# Patient Record
Sex: Female | Born: 1955
Health system: Southern US, Community
[De-identification: ages and names within clinical notes are randomized; demographics above are authoritative.]

## PROBLEM LIST (undated history)

## (undated) DIAGNOSIS — E039 Hypothyroidism, unspecified: Secondary | ICD-10-CM

## (undated) DIAGNOSIS — J45909 Unspecified asthma, uncomplicated: Secondary | ICD-10-CM

## (undated) HISTORY — PX: KNEE SURGERY: SHX244

## (undated) HISTORY — PX: CARPAL TUNNEL RELEASE: SHX101

## (undated) HISTORY — DX: Hypothyroidism, unspecified: E03.9

## (undated) HISTORY — PX: REPAIR ANKLE LIGAMENT: SUR1187

## (undated) HISTORY — PX: SQUAMOUS CELL CARCINOMA EXCISION: SHX2433

## (undated) HISTORY — DX: Unspecified asthma, uncomplicated: J45.909

---

## 2009-05-27 ENCOUNTER — Ambulatory Visit: Payer: Self-pay

## 2010-10-13 ENCOUNTER — Ambulatory Visit: Payer: Self-pay | Admitting: *Deleted

## 2012-01-02 ENCOUNTER — Ambulatory Visit: Payer: Self-pay | Admitting: Specialist

## 2012-08-28 ENCOUNTER — Ambulatory Visit: Payer: Self-pay | Admitting: Family Medicine

## 2014-09-24 DIAGNOSIS — J45990 Exercise induced bronchospasm: Secondary | ICD-10-CM | POA: Insufficient documentation

## 2014-10-05 ENCOUNTER — Other Ambulatory Visit: Payer: Self-pay | Admitting: Family Medicine

## 2014-10-05 ENCOUNTER — Ambulatory Visit
Admission: RE | Admit: 2014-10-05 | Discharge: 2014-10-05 | Disposition: A | Discharge status: Home / Self Care | Payer: BLUE CROSS/BLUE SHIELD | Source: Ambulatory Visit | Attending: Family Medicine | Admitting: Family Medicine

## 2014-10-05 DIAGNOSIS — S52501A Unspecified fracture of the lower end of right radius, initial encounter for closed fracture: Secondary | ICD-10-CM | POA: Insufficient documentation

## 2014-10-05 DIAGNOSIS — Y939 Activity, unspecified: Secondary | ICD-10-CM | POA: Insufficient documentation

## 2014-10-05 DIAGNOSIS — Y929 Unspecified place or not applicable: Secondary | ICD-10-CM | POA: Diagnosis not present

## 2014-10-05 DIAGNOSIS — X58XXXA Exposure to other specified factors, initial encounter: Secondary | ICD-10-CM | POA: Insufficient documentation

## 2014-10-05 DIAGNOSIS — Y999 Unspecified external cause status: Secondary | ICD-10-CM | POA: Insufficient documentation

## 2014-10-05 DIAGNOSIS — S63501A Unspecified sprain of right wrist, initial encounter: Secondary | ICD-10-CM | POA: Diagnosis present

## 2015-09-08 ENCOUNTER — Other Ambulatory Visit: Payer: Self-pay | Admitting: Pediatrics

## 2015-09-08 ENCOUNTER — Other Ambulatory Visit: Payer: Self-pay | Admitting: Family Medicine

## 2015-09-08 DIAGNOSIS — Z1231 Encounter for screening mammogram for malignant neoplasm of breast: Secondary | ICD-10-CM

## 2015-09-16 ENCOUNTER — Ambulatory Visit
Admission: RE | Admit: 2015-09-16 | Discharge: 2015-09-16 | Disposition: A | Discharge status: Home / Self Care | Payer: BLUE CROSS/BLUE SHIELD | Source: Ambulatory Visit | Attending: Pediatrics | Admitting: Pediatrics

## 2015-09-16 DIAGNOSIS — Z1231 Encounter for screening mammogram for malignant neoplasm of breast: Secondary | ICD-10-CM

## 2017-02-23 ENCOUNTER — Other Ambulatory Visit: Payer: Self-pay | Admitting: Internal Medicine

## 2017-02-23 ENCOUNTER — Ambulatory Visit
Admission: RE | Admit: 2017-02-23 | Discharge: 2017-02-23 | Disposition: A | Discharge status: Home / Self Care | Payer: BLUE CROSS/BLUE SHIELD | Source: Ambulatory Visit | Attending: Internal Medicine | Admitting: Internal Medicine

## 2017-02-23 DIAGNOSIS — R7611 Nonspecific reaction to tuberculin skin test without active tuberculosis: Secondary | ICD-10-CM | POA: Diagnosis present

## 2017-03-20 ENCOUNTER — Ambulatory Visit (INDEPENDENT_AMBULATORY_CARE_PROVIDER_SITE_OTHER): Payer: BLUE CROSS/BLUE SHIELD | Admitting: Obstetrics and Gynecology

## 2017-03-20 ENCOUNTER — Encounter: Payer: Self-pay | Admitting: Obstetrics and Gynecology

## 2017-03-20 VITALS — BP 124/78 | Ht 63.0 in | Wt 138.0 lb

## 2017-03-20 DIAGNOSIS — Z124 Encounter for screening for malignant neoplasm of cervix: Secondary | ICD-10-CM

## 2017-03-20 DIAGNOSIS — Z1211 Encounter for screening for malignant neoplasm of colon: Secondary | ICD-10-CM | POA: Diagnosis not present

## 2017-03-20 DIAGNOSIS — E039 Hypothyroidism, unspecified: Secondary | ICD-10-CM

## 2017-03-20 DIAGNOSIS — Z1339 Encounter for screening examination for other mental health and behavioral disorders: Secondary | ICD-10-CM

## 2017-03-20 DIAGNOSIS — Z01419 Encounter for gynecological examination (general) (routine) without abnormal findings: Secondary | ICD-10-CM | POA: Diagnosis not present

## 2017-03-20 DIAGNOSIS — Z1331 Encounter for screening for depression: Secondary | ICD-10-CM | POA: Diagnosis not present

## 2017-03-20 NOTE — Progress Notes (Signed)
Routine Annual Gynecology Examination   PCP: Delton Prairieobin, Paul, MD  Chief Complaint  Patient presents with  . Annual Exam    History of Present Illness: Patient is a 61 y.o. [id number] presents for annual exam. The patient has no complaints today.   Menopausal bleeding: denies  Menopausal symptoms: denies  Breast symptoms: denies  Last pap smear: 1 years ago.  Result: NILM, HPV+  Last mammogram: 08/2015 [facility]  Past Medical History:  Diagnosis Date  . Asthma   . Hypothyroidism     Past Surgical History:  Procedure Laterality Date  . CARPAL TUNNEL RELEASE    . CESAREAN SECTION    . KNEE SURGERY    . REPAIR ANKLE LIGAMENT      Medications   Medication Sig Start Date End Date Taking? Authorizing Provider  albuterol (PROVENTIL HFA;VENTOLIN HFA) 108 (90 Base) MCG/ACT inhaler 2 puffs 15 minutes prior to exercise 09/24/14  Yes [provider]  ARMOUR THYROID 90 MG tablet  12/11/16   [provider]    Allergies  Allergen Reactions  . Celecoxib    Obstetric History: [id number]  Social History   Social History  . Marital status: Married    Spouse name: N/A  . Number of children: N/A  . Years of education: N/A   Occupational History  . Not on file.   Social History Main Topics  . Smoking status: Never Smoker  . Smokeless tobacco: Never Used  . Alcohol use Yes  . Drug use: No  . Sexual activity: Not on file   Other Topics Concern  . Not on file   Social History Narrative  . No narrative on file    Family History  Problem Relation Age of Onset  . Breast cancer Mother 2975  . Coronary artery disease Mother   . Hypertension Mother   . Breast cancer Cousin   . Hypertension Father   . Stroke Father   . Thyroid cancer Father   . Hypertension Maternal Grandmother   . Hypertension Maternal Grandfather   . Hypertension Paternal Grandmother   . Hypertension Paternal Grandfather     Review of Systems  Constitutional: Negative.   HENT:  Negative.   Eyes: Negative.   Respiratory: Negative.   Cardiovascular: Negative.   Gastrointestinal: Negative.   Genitourinary: Negative.   Musculoskeletal: Negative.   Skin: Negative.   Neurological: Negative.   Psychiatric/Behavioral: Negative.      Physical Exam Vitals: BP 124/78   Ht 5\' 3"  (1.6 m)   Wt 138 lb (62.6 kg)   LMP  (LMP Unknown) Comment: denies preg  BMI 24.45 kg/m   Physical Exam  Constitutional: She is oriented to person, place, and time. She appears well-developed and well-nourished. No distress.  Genitourinary: Vagina normal and uterus normal. Pelvic exam was performed with patient supine. There is no rash, tenderness or lesion on the right labia. There is no rash, tenderness or lesion on the left labia. Right adnexum does not display mass, does not display tenderness and does not display fullness. Left adnexum does not display mass, does not display tenderness and does not display fullness. Cervix does not exhibit motion tenderness, lesion, discharge or polyp.   Uterus is mobile and anteverted. Uterus is not enlarged, tender, exhibiting a mass or irregular (is regular).  HENT:  Head: Normocephalic and atraumatic.  Eyes: EOM are normal. No scleral icterus.  Neck: Normal range of motion. Neck supple. No thyromegaly present.  Cardiovascular: Normal rate and regular rhythm.  Exam reveals no gallop and no friction rub.   No murmur heard. Pulmonary/Chest: Effort normal and breath sounds normal. No respiratory distress. She has no wheezes. She has no rales.  Abdominal: Soft. Bowel sounds are normal. She exhibits no distension and no mass. There is no tenderness. There is no rebound and no guarding.  Musculoskeletal: Normal range of motion. She exhibits no edema.  Lymphadenopathy:    She has no cervical adenopathy.  Neurological: She is alert and oriented to person, place, and time. No cranial nerve deficit.  Skin: Skin is warm and dry. No erythema.  Psychiatric: She  has a normal mood and affect. Her behavior is normal. Judgment normal.     Female chaperone present for pelvic and breast  portions of the physical exam  Results: AUDIT Questionnaire (screen for alcoholism): 3 PHQ-9: 0   Assessment and Plan:  61 y.o. G31P2002 female here for routine annual gynecologic examination  Plan: Problem List Items Addressed This Visit    Acquired hypothyroidism   Relevant Medications   ARMOUR THYROID 90 MG tablet   Other Relevant Orders   TSH + free T4 (Completed)    Other Visit Diagnoses    Women's annual routine gynecological examination    -  Primary   Relevant Orders   IGP, Aptima HPV, rfx 16/18,45   Pap smear for cervical cancer screening       Relevant Orders   IGP, Aptima HPV, rfx 16/18,45   Screening for depression       Screening for alcoholism          Screening: -- Blood pressure screen normal -- Colonoscopy - need to verify, if she is receiving this through me or another provider (Dr. Mariana Kaufman, I believe is her PCP) -- Mammogram - due in April as patient has elected to get hers every two years -- Weight screening: normal -- Depression screening negative (PHQ-9) -- Nutrition: normal -- cholesterol screening: patient refuses, as she states that she will not take medication or treatment regardless of results -- osteoporosis screening: not due -- tobacco screening: not using -- alcohol screening: AUDIT questionnaire indicates low-risk usage. -- family history of breast cancer screening: done. not at high risk. -- no evidence of domestic violence or intimate partner violence. -- STD screening: gonorrhea/chlamydia NAAT not collected per patient request. 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 She does not want to have it at this time.  Thomasene Mohair, MD 03/23/2017 11:05 AM

## 2017-03-21 LAB — TSH+FREE T4
Free T4: 0.88 ng/dL (ref 0.82–1.77)
TSH: 0.921 u[IU]/mL (ref 0.450–4.500)

## 2017-03-23 ENCOUNTER — Encounter: Payer: Self-pay | Admitting: Obstetrics and Gynecology

## 2017-03-23 LAB — IGP, APTIMA HPV, RFX 16/18,45
HPV APTIMA: NEGATIVE
PAP SMEAR COMMENT: 0

## 2017-03-26 MED ORDER — ARMOUR THYROID 90 MG PO TABS: 90.0000 mg | ORAL_TABLET | Freq: Every day | ORAL | 4 refills | Status: DC

## 2017-03-26 NOTE — Addendum Note (Signed)
Addended by: Thomasene MohairJACKSON, Markey Deady D on: 03/26/2017 09:52 PM   Modules accepted: Orders

## 2018-04-05 ENCOUNTER — Encounter: Payer: Self-pay | Admitting: Obstetrics and Gynecology

## 2018-04-05 ENCOUNTER — Ambulatory Visit (INDEPENDENT_AMBULATORY_CARE_PROVIDER_SITE_OTHER): Payer: BLUE CROSS/BLUE SHIELD | Admitting: Obstetrics and Gynecology

## 2018-04-05 VITALS — BP 128/74 | Ht 63.0 in | Wt 134.0 lb

## 2018-04-05 DIAGNOSIS — S52539A Colles' fracture of unspecified radius, initial encounter for closed fracture: Secondary | ICD-10-CM | POA: Insufficient documentation

## 2018-04-05 DIAGNOSIS — Z01419 Encounter for gynecological examination (general) (routine) without abnormal findings: Secondary | ICD-10-CM | POA: Diagnosis not present

## 2018-04-05 DIAGNOSIS — Z1331 Encounter for screening for depression: Secondary | ICD-10-CM

## 2018-04-05 NOTE — Progress Notes (Signed)
Routine Annual Gynecology Examination   PCP: Delton Prairie, MD  Chief Complaint  Patient presents with  . Annual Exam   History of Present Illness: Patient is a 62 y.o. G2P2002 presents for annual exam. The patient has no complaints today.   Menopausal bleeding: denies  Menopausal symptoms: denies  Breast symptoms: denies  Last pap smear: 1 years ago.  Result Normal  Last mammogram: 2 years ago.  Result Normal   Colonoscopy: She is due and has scheduled.   Last thyroid check: June, normal labs. Stable on current dose of Armour thyroid for years.  Past Medical History:  Diagnosis Date  . Asthma   . Hypothyroidism    Past Surgical History:  Procedure Laterality Date  . CARPAL TUNNEL RELEASE    . CESAREAN SECTION    . KNEE SURGERY    . REPAIR ANKLE LIGAMENT    . SQUAMOUS CELL CARCINOMA EXCISION     Prior to Admission medications   Medication Sig Start Date End Date Taking? Authorizing Provider  albuterol (PROVENTIL HFA;VENTOLIN HFA) 108 (90 Base) MCG/ACT inhaler 2 puffs 15 minutes prior to exercise 09/24/14  Yes [provider]  ARMOUR THYROID 90 MG tablet Take 1 tablet (90 mg total) by mouth daily. 03/26/17  Yes Conard Novak, MD    Allergies  Allergen Reactions  . Celecoxib    Obstetric History: [id number]  Social History   Socioeconomic History  . Marital status: Married    Spouse name: Not on file  . Number of children: Not on file  . Years of education: Not on file  . Highest education level: Not on file  Occupational History  . Not on file  Social Needs  . Financial resource strain: Not on file  . Food insecurity:    Worry: Not on file    Inability: Not on file  . Transportation needs:    Medical: Not on file    Non-medical: Not on file  Tobacco Use  . Smoking status: Never Smoker  . Smokeless tobacco: Never Used  Substance and Sexual Activity  . Alcohol use: Yes  . Drug use: No  . Sexual activity: Not on file  Lifestyle  .  Physical activity:    Days per week: Not on file    Minutes per session: Not on file  . Stress: Not on file  Relationships  . Social connections:    Talks on phone: Not on file    Gets together: Not on file    Attends religious service: Not on file    Active member of club or organization: Not on file    Attends meetings of clubs or organizations: Not on file    Relationship status: Not on file  . Intimate partner violence:    Fear of current or ex partner: Not on file    Emotionally abused: Not on file    Physically abused: Not on file    Forced sexual activity: Not on file  Other Topics Concern  . Not on file  Social History Narrative  . Not on file    Family History  Problem Relation Age of Onset  . Breast cancer Mother 64  . Coronary artery disease Mother   . Hypertension Mother   . Breast cancer Cousin   . Hypertension Father   . Stroke Father   . Thyroid cancer Father   . Hypertension Maternal Grandmother   . Hypertension Maternal Grandfather   . Hypertension Paternal Grandmother   .  Hypertension Paternal Grandfather     Review of Systems  Constitutional: Negative.   HENT: Negative.   Eyes: Negative.   Respiratory: Negative.   Cardiovascular: Negative.   Gastrointestinal: Negative.   Genitourinary: Negative.   Musculoskeletal: Negative.   Skin: Negative.   Neurological: Negative.   Psychiatric/Behavioral: Negative.      Physical Exam Vitals: BP 128/74   Ht 5\' 3"  (1.6 m)   Wt 134 lb (60.8 kg)   LMP  (LMP Unknown) Comment: denies preg  BMI 23.74 kg/m   Physical Exam  Constitutional: She is oriented to person, place, and time. She appears well-developed and well-nourished. No distress.  Genitourinary: Uterus normal. Pelvic exam was performed with patient supine. There is no rash, tenderness, lesion or injury on the right labia. There is no rash, tenderness, lesion or injury on the left labia. No erythema, tenderness or bleeding in the vagina. No signs  of injury around the vagina. No vaginal discharge found. Right adnexum does not display mass, does not display tenderness and does not display fullness. Left adnexum does not display mass, does not display tenderness and does not display fullness. Cervix does not exhibit motion tenderness, lesion, discharge or polyp.   Uterus is mobile and anteverted. Uterus is not enlarged, tender or exhibiting a mass.  HENT:  Head: Normocephalic and atraumatic.  Eyes: EOM are normal. No scleral icterus.  Neck: Normal range of motion. Neck supple. No thyromegaly present.  Cardiovascular: Normal rate and regular rhythm. Exam reveals no gallop and no friction rub.  No murmur heard. Pulmonary/Chest: Effort normal and breath sounds normal. No respiratory distress. She has no wheezes. She has no rales. Right breast exhibits no inverted nipple, no mass, no nipple discharge, no skin change and no tenderness. Left breast exhibits no inverted nipple, no mass, no nipple discharge, no skin change and no tenderness.  Abdominal: Soft. Bowel sounds are normal. She exhibits no distension and no mass. There is no tenderness. There is no rebound and no guarding.  Musculoskeletal: Normal range of motion. She exhibits no edema or tenderness.  Lymphadenopathy:    She has no cervical adenopathy.       Right: No inguinal adenopathy present.       Left: No inguinal adenopathy present.  Neurological: She is alert and oriented to person, place, and time. No cranial nerve deficit.  Skin: Skin is warm and dry. No rash noted. No erythema.  Psychiatric: She has a normal mood and affect. Her behavior is normal. Judgment normal.    Female chaperone present for pelvic and breast  portions of the physical exam  Results: PHQ-9: 0   Assessment and Plan:  62 y.o. G3P2002 female here for routine annual gynecologic examination  Plan: Problem List Items Addressed This Visit    None    Visit Diagnoses    Women's annual routine  gynecological examination    -  Primary   Screening for depression          Screening: -- Blood pressure screen normal -- Colonoscopy - due. Patient already taking steps to schedule -- Mammogram - due. Patient to call [facility] to arrange. She understands that it is her responsibility to arrange this. -- Weight screening: normal -- Depression screening negative (PHQ-9) -- Nutrition: normal -- cholesterol screening: patient declines -- osteoporosis screening: not due -- tobacco screening: not using -- family history of breast cancer screening: done. not at high risk. -- no evidence of domestic violence or intimate partner violence. -- STD  screening: gonorrhea/chlamydia NAAT not collected per patient request. -- pap smear not collected per ASCCP guidelines -- flu vaccine up to date -- HPV vaccination series: not eligilbe   Thomasene Mohair, MD 04/05/2018 5:50 PM

## 2018-04-22 ENCOUNTER — Other Ambulatory Visit: Payer: Self-pay | Admitting: Obstetrics and Gynecology

## 2018-04-22 DIAGNOSIS — Z1231 Encounter for screening mammogram for malignant neoplasm of breast: Secondary | ICD-10-CM

## 2018-05-02 ENCOUNTER — Ambulatory Visit
Admission: RE | Admit: 2018-05-02 | Discharge: 2018-05-02 | Disposition: A | Discharge status: Home / Self Care | Payer: BLUE CROSS/BLUE SHIELD | Source: Ambulatory Visit | Attending: Obstetrics and Gynecology | Admitting: Obstetrics and Gynecology

## 2018-05-02 DIAGNOSIS — Z1231 Encounter for screening mammogram for malignant neoplasm of breast: Secondary | ICD-10-CM | POA: Insufficient documentation

## 2018-05-13 ENCOUNTER — Other Ambulatory Visit: Payer: Self-pay | Admitting: Obstetrics and Gynecology

## 2018-05-13 DIAGNOSIS — E039 Hypothyroidism, unspecified: Secondary | ICD-10-CM

## 2019-04-18 ENCOUNTER — Ambulatory Visit: Payer: BLUE CROSS/BLUE SHIELD | Admitting: Obstetrics and Gynecology

## 2019-05-09 ENCOUNTER — Other Ambulatory Visit: Payer: Self-pay

## 2019-05-09 ENCOUNTER — Encounter: Payer: Self-pay | Admitting: Obstetrics and Gynecology

## 2019-05-09 ENCOUNTER — Ambulatory Visit (INDEPENDENT_AMBULATORY_CARE_PROVIDER_SITE_OTHER): Payer: BC Managed Care – PPO | Admitting: Obstetrics and Gynecology

## 2019-05-09 VITALS — BP 118/70 | Ht 63.0 in | Wt 134.0 lb

## 2019-05-09 DIAGNOSIS — E039 Hypothyroidism, unspecified: Secondary | ICD-10-CM

## 2019-05-09 DIAGNOSIS — Z1339 Encounter for screening examination for other mental health and behavioral disorders: Secondary | ICD-10-CM

## 2019-05-09 DIAGNOSIS — Z01419 Encounter for gynecological examination (general) (routine) without abnormal findings: Secondary | ICD-10-CM | POA: Diagnosis not present

## 2019-05-09 DIAGNOSIS — Z1331 Encounter for screening for depression: Secondary | ICD-10-CM

## 2019-05-09 NOTE — Progress Notes (Signed)
Routine Annual Gynecology Examination   PCP: Delton Prairie, MD  Chief Complaint  Patient presents with  . Annual Exam    History of Present Illness: Patient is a 63 y.o. G2P2002 presents for annual exam. The patient has no complaints today.   Menopausal bleeding: denies  Menopausal symptoms: denies  Breast symptoms: denies  Last pap smear: 2 years ago.  Result Normal  Last mammogram: 1 years ago.  Result Normal   Last colonoscopy: 1 year ago.  Normal.  Follow up 10 years.   Past Medical History:  Diagnosis Date  . Asthma   . Hypothyroidism     Past Surgical History:  Procedure Laterality Date  . CARPAL TUNNEL RELEASE    . CESAREAN SECTION    . KNEE SURGERY    . REPAIR ANKLE LIGAMENT    . SQUAMOUS CELL CARCINOMA EXCISION      Prior to Admission medications   Medication Sig Start Date End Date Taking? Authorizing Provider  albuterol (PROVENTIL HFA;VENTOLIN HFA) 108 (90 Base) MCG/ACT inhaler 2 puffs 15 minutes prior to exercise 09/24/14  Yes [provider]  ARMOUR THYROID 90 MG tablet TAKE (1) TABLET BY MOUTH EVERY DAY 05/13/18  Yes Conard Novak, MD    Allergies  Allergen Reactions  . Celecoxib    Obstetric History: [id number]  Social History   Socioeconomic History  . Marital status: Married    Spouse name: Not on file  . Number of children: Not on file  . Years of education: Not on file  . Highest education level: Not on file  Occupational History  . Not on file  Tobacco Use  . Smoking status: Never Smoker  . Smokeless tobacco: Never Used  Substance and Sexual Activity  . Alcohol use: Yes  . Drug use: No  . Sexual activity: Not on file  Other Topics Concern  . Not on file  Social History Narrative  . Not on file   Social Determinants of Health   Financial Resource Strain:   . Difficulty of Paying Living Expenses: Not on file  Food Insecurity:   . Worried About Programme researcher, broadcasting/film/video in the Last Year: Not on file  . Ran Out of  Food in the Last Year: Not on file  Transportation Needs:   . Lack of Transportation (Medical): Not on file  . Lack of Transportation (Non-Medical): Not on file  Physical Activity:   . Days of Exercise per Week: Not on file  . Minutes of Exercise per Session: Not on file  Stress:   . Feeling of Stress : Not on file  Social Connections:   . Frequency of Communication with Friends and Family: Not on file  . Frequency of Social Gatherings with Friends and Family: Not on file  . Attends Religious Services: Not on file  . Active Member of Clubs or Organizations: Not on file  . Attends Banker Meetings: Not on file  . Marital Status: Not on file  Intimate Partner Violence:   . Fear of Current or Ex-Partner: Not on file  . Emotionally Abused: Not on file  . Physically Abused: Not on file  . Sexually Abused: Not on file    Family History  Problem Relation Age of Onset  . Breast cancer Mother 53  . Coronary artery disease Mother   . Hypertension Mother   . Breast cancer Cousin        mat cousin  . Hypertension Father   . Stroke  Father   . Thyroid cancer Father   . Hypertension Maternal Grandmother   . Hypertension Maternal Grandfather   . Hypertension Paternal Grandmother   . Hypertension Paternal Grandfather     Review of Systems  Constitutional: Negative.   HENT: Negative.   Eyes: Negative.   Respiratory: Negative.   Cardiovascular: Negative.   Gastrointestinal: Negative.   Genitourinary: Negative.   Musculoskeletal: Negative.   Skin: Negative.   Neurological: Negative.   Psychiatric/Behavioral: Negative.     Physical Exam Vitals: BP 118/70   Ht  (1.6 m)   Wt 134 lb (60.8 kg)   LMP  (LMP Unknown) Comment: denies preg  BMI 23.74 kg/m   Physical Exam Constitutional:      General: She is not in acute distress.    Appearance: Normal appearance. She is well-developed.  Genitourinary:     Pelvic exam was performed with patient supine.     Vulva,  urethra, bladder and uterus normal.     No inguinal adenopathy present in the right or left side.    No signs of injury in the vagina.     No vaginal discharge, erythema, tenderness or bleeding.     No cervical motion tenderness, discharge, lesion or polyp.     Uterus is mobile.     Uterus is not enlarged or tender.     No uterine mass detected.    Uterus is anteverted.     No right or left adnexal mass present.     Right adnexa not tender or full.     Left adnexa not tender or full.  HENT:     Head: Normocephalic and atraumatic.  Eyes:     General: No scleral icterus.    Conjunctiva/sclera: Conjunctivae normal.  Neck:     Thyroid: No thyromegaly.  Cardiovascular:     Rate and Rhythm: Normal rate and regular rhythm.     Heart sounds: No murmur. No friction rub. No gallop.   Pulmonary:     Effort: Pulmonary effort is normal. No respiratory distress.     Breath sounds: Normal breath sounds. No wheezing or rales.  Chest:     Breasts:        Right: No inverted nipple, mass, nipple discharge, skin change or tenderness.        Left: No inverted nipple, mass, nipple discharge, skin change or tenderness.  Abdominal:     General: Bowel sounds are normal. There is no distension.     Palpations: Abdomen is soft. There is no mass.     Tenderness: There is no abdominal tenderness. There is no guarding or rebound.  Musculoskeletal:        General: No swelling or tenderness. Normal range of motion.     Cervical back: Normal range of motion and neck supple.  Lymphadenopathy:     Cervical: No cervical adenopathy.     Lower Body: No right inguinal adenopathy. No left inguinal adenopathy.  Neurological:     General: No focal deficit present.     Mental Status: She is alert and oriented to person, place, and time.     Cranial Nerves: No cranial nerve deficit.  Skin:    General: Skin is warm and dry.     Findings: No erythema or rash.  Psychiatric:        Mood and Affect: Mood normal.         Behavior: Behavior normal.        Judgment: Judgment normal.  Female chaperone present for pelvic and breast  portions of the physical exam  Results: AUDIT Questionnaire (screen for alcoholism): 0 PHQ-9: 0   Assessment and Plan:  63 y.o. G22P2002 female here for routine annual gynecologic examination  Plan: Problem List Items Addressed This Visit      Endocrine   Acquired hypothyroidism   Relevant Orders   TSH + free T4 ([facility])    Other Visit Diagnoses    Women's annual routine gynecological examination    -  Primary   Relevant Orders   TSH + free T4 ([facility])   Screening for depression       Screening for alcoholism          Screening: -- Blood pressure screen normal -- Colonoscopy - not due -- Mammogram - not due -- Weight screening: normal -- Depression screening negative (PHQ-9) -- Nutrition: normal -- cholesterol screening: per PCP -- osteoporosis screening: not due -- tobacco screening: not using -- alcohol screening: AUDIT questionnaire indicates low-risk usage. -- family history of breast cancer screening: done. not at high risk. -- no evidence of domestic violence or intimate partner violence. -- STD screening: gonorrhea/chlamydia NAAT not collected per patient request. -- pap smear not collected per ASCCP guidelines -- flu vaccine received -- HPV vaccination series: not eligilbe   Prentice Docker, MD 05/09/2019 4:00 PM

## 2019-05-10 LAB — TSH+FREE T4
Free T4: 0.85 ng/dL (ref 0.82–1.77)
TSH: 3.65 u[IU]/mL (ref 0.450–4.500)

## 2019-06-19 ENCOUNTER — Other Ambulatory Visit: Payer: Self-pay | Admitting: Obstetrics and Gynecology

## 2019-06-19 DIAGNOSIS — E039 Hypothyroidism, unspecified: Secondary | ICD-10-CM

## 2019-10-10 ENCOUNTER — Other Ambulatory Visit: Payer: Self-pay | Admitting: Obstetrics and Gynecology

## 2019-10-10 DIAGNOSIS — J45990 Exercise induced bronchospasm: Secondary | ICD-10-CM

## 2019-10-10 MED ORDER — ALBUTEROL SULFATE HFA 108 (90 BASE) MCG/ACT IN AERS: INHALATION_SPRAY | RESPIRATORY_TRACT | 3 refills | Status: DC

## 2020-05-14 ENCOUNTER — Other Ambulatory Visit: Payer: Self-pay

## 2020-05-14 ENCOUNTER — Encounter: Payer: Self-pay | Admitting: Obstetrics and Gynecology

## 2020-05-14 ENCOUNTER — Ambulatory Visit (INDEPENDENT_AMBULATORY_CARE_PROVIDER_SITE_OTHER): Payer: BC Managed Care – PPO | Admitting: Obstetrics and Gynecology

## 2020-05-14 VITALS — BP 122/74 | Ht 63.0 in | Wt 138.0 lb

## 2020-05-14 DIAGNOSIS — Z1331 Encounter for screening for depression: Secondary | ICD-10-CM

## 2020-05-14 DIAGNOSIS — Z01419 Encounter for gynecological examination (general) (routine) without abnormal findings: Secondary | ICD-10-CM | POA: Diagnosis not present

## 2020-05-14 DIAGNOSIS — E039 Hypothyroidism, unspecified: Secondary | ICD-10-CM | POA: Diagnosis not present

## 2020-05-14 DIAGNOSIS — Z1339 Encounter for screening examination for other mental health and behavioral disorders: Secondary | ICD-10-CM | POA: Diagnosis not present

## 2020-05-14 NOTE — Progress Notes (Signed)
Routine Annual Gynecology Examination   PCP: Delton Prairie, MD  Chief Complaint  Patient presents with  . Annual Exam   History of Present Illness: Patient is a 64 y.o. G2P2002 presents for annual exam. The patient has no complaints today.   Menopausal bleeding: denies  Menopausal symptoms: denies  Breast symptoms: denies  Last pap smear: 2 years ago.  Result Normal  Last mammogram: 2 years ago.  Result Normal   Last colonoscopy: 2 year ago.  Normal.  Follow up 10 years.   Past Medical History:  Diagnosis Date  . Asthma   . Hypothyroidism    Past Surgical History:  Procedure Laterality Date  . CARPAL TUNNEL RELEASE    . CESAREAN SECTION    . KNEE SURGERY    . REPAIR ANKLE LIGAMENT    . SQUAMOUS CELL CARCINOMA EXCISION     Prior to Admission medications   Medication Sig Start Date End Date Taking? Authorizing Provider  albuterol (PROVENTIL HFA;VENTOLIN HFA) 108 (90 Base) MCG/ACT inhaler 2 puffs 15 minutes prior to exercise 09/24/14  Yes [provider]  ARMOUR THYROID 90 MG tablet TAKE (1) TABLET BY MOUTH EVERY DAY 05/13/18  Yes Conard Novak, MD   Allergies  Allergen Reactions  . Celecoxib    Obstetric History: [id number]  Social History   Socioeconomic History  . Marital status: Married    Spouse name: Not on file  . Number of children: Not on file  . Years of education: Not on file  . Highest education level: Not on file  Occupational History  . Not on file  Tobacco Use  . Smoking status: Never Smoker  . Smokeless tobacco: Never Used  Vaping Use  . Vaping Use: Never used  Substance and Sexual Activity  . Alcohol use: Yes  . Drug use: No  . Sexual activity: Not on file  Other Topics Concern  . Not on file  Social History Narrative  . Not on file   Social Determinants of Health   Financial Resource Strain: Not on file  Food Insecurity: Not on file  Transportation Needs: Not on file  Physical Activity: Not on file  Stress: Not on  file  Social Connections: Not on file  Intimate Partner Violence: Not on file    Family History  Problem Relation Age of Onset  . Breast cancer Mother 37  . Coronary artery disease Mother   . Hypertension Mother   . Breast cancer Cousin        mat cousin  . Hypertension Father   . Stroke Father   . Thyroid cancer Father   . Hypertension Maternal Grandmother   . Hypertension Maternal Grandfather   . Hypertension Paternal Grandmother   . Hypertension Paternal Grandfather     Review of Systems  Constitutional: Negative.   HENT: Negative.   Eyes: Negative.   Respiratory: Negative.   Cardiovascular: Negative.   Gastrointestinal: Negative.   Genitourinary: Negative.   Musculoskeletal: Negative.   Skin: Negative.   Neurological: Negative.   Psychiatric/Behavioral: Negative.     Physical Exam Vitals: BP 122/74   Ht 5\' 3"  (1.6 m)   Wt 138 lb (62.6 kg)   LMP  (LMP Unknown) Comment: denies preg  BMI 24.45 kg/m   Physical Exam Constitutional:      General: She is not in acute distress.    Appearance: Normal appearance. She is well-developed.  Genitourinary:     Vulva and bladder normal.     No  vaginal discharge, erythema, tenderness or bleeding.      Right Adnexa: not tender, not full and no mass present.    Left Adnexa: not tender, not full and no mass present.    No cervical motion tenderness, discharge, lesion or polyp.     Uterus is not enlarged or tender.     No uterine mass detected. Breasts:     Right: No inverted nipple, mass, nipple discharge, skin change or tenderness.     Left: No inverted nipple, mass, nipple discharge, skin change or tenderness.    HENT:     Head: Normocephalic and atraumatic.  Eyes:     General: No scleral icterus.    Conjunctiva/sclera: Conjunctivae normal.  Neck:     Thyroid: No thyromegaly.  Cardiovascular:     Rate and Rhythm: Normal rate and regular rhythm.     Heart sounds: No murmur heard. No friction rub. No gallop.    Pulmonary:     Effort: Pulmonary effort is normal. No respiratory distress.     Breath sounds: Normal breath sounds. No wheezing or rales.  Abdominal:     General: Bowel sounds are normal. There is no distension.     Palpations: Abdomen is soft. There is no mass.     Tenderness: There is no abdominal tenderness. There is no guarding or rebound.  Musculoskeletal:        General: No swelling or tenderness. Normal range of motion.     Cervical back: Normal range of motion and neck supple.  Lymphadenopathy:     Cervical: No cervical adenopathy.     Lower Body: No right inguinal adenopathy. No left inguinal adenopathy.  Neurological:     General: No focal deficit present.     Mental Status: She is alert and oriented to person, place, and time.     Cranial Nerves: No cranial nerve deficit.  Skin:    General: Skin is warm and dry.     Findings: No erythema or rash.  Psychiatric:        Mood and Affect: Mood normal.        Behavior: Behavior normal.        Judgment: Judgment normal.     Female chaperone present for pelvic and breast  portions of the physical exam  Results: AUDIT Questionnaire (screen for alcoholism): 0 PHQ-9: 0   Assessment and Plan:  64 y.o. G57P2002 female here for routine annual gynecologic examination  Plan: Problem List Items Addressed This Visit      Endocrine   Acquired hypothyroidism    Other Visit Diagnoses    Women's annual routine gynecological examination    -  Primary   Screening for depression       Screening for alcoholism          Screening: -- Blood pressure screen normal -- Colonoscopy - not due -- Mammogram - due. Patient to call [facility] to arrange. She understands that it is her responsibility to arrange this. -- Weight screening: normal -- Depression screening negative (PHQ-9) -- Nutrition: normal -- cholesterol screening: she brought values in.  LDL slightly elevated at 163.  Otherwise normal. Continue to monitor.  She is doing  all the necessary lifestyle recommendations. -- osteoporosis screening: not due -- tobacco screening: not using -- alcohol screening: AUDIT questionnaire indicates low-risk usage. -- family history of breast cancer screening: done. not at high risk. -- no evidence of domestic violence or intimate partner violence. -- STD screening: gonorrhea/chlamydia NAAT not collected  per patient request. -- pap smear not collected per ASCCP guidelines -- flu vaccine received -- HPV vaccination series: not eligilbe   Thomasene Mohair, MD 05/14/2020 4:06 PM

## 2020-05-31 ENCOUNTER — Other Ambulatory Visit: Payer: Self-pay | Admitting: Obstetrics and Gynecology

## 2020-05-31 DIAGNOSIS — J45990 Exercise induced bronchospasm: Secondary | ICD-10-CM

## 2020-07-06 ENCOUNTER — Other Ambulatory Visit: Payer: Self-pay | Admitting: Obstetrics and Gynecology

## 2020-07-06 DIAGNOSIS — E039 Hypothyroidism, unspecified: Secondary | ICD-10-CM

## 2021-03-03 ENCOUNTER — Other Ambulatory Visit: Payer: Self-pay | Admitting: Obstetrics and Gynecology

## 2021-03-03 DIAGNOSIS — Z1231 Encounter for screening mammogram for malignant neoplasm of breast: Secondary | ICD-10-CM

## 2021-03-21 ENCOUNTER — Ambulatory Visit
Admission: RE | Admit: 2021-03-21 | Discharge: 2021-03-21 | Disposition: A | Discharge status: Home / Self Care | Payer: BC Managed Care – PPO | Source: Ambulatory Visit | Attending: Obstetrics and Gynecology | Admitting: Obstetrics and Gynecology

## 2021-03-21 ENCOUNTER — Other Ambulatory Visit: Payer: Self-pay

## 2021-03-21 DIAGNOSIS — Z1231 Encounter for screening mammogram for malignant neoplasm of breast: Secondary | ICD-10-CM | POA: Diagnosis not present

## 2021-07-22 ENCOUNTER — Encounter: Payer: Self-pay | Admitting: Advanced Practice Midwife

## 2021-07-22 ENCOUNTER — Other Ambulatory Visit (HOSPITAL_COMMUNITY)
Admission: RE | Admit: 2021-07-22 | Discharge: 2021-07-22 | Disposition: A | Discharge status: Home / Self Care | Payer: BC Managed Care – PPO | Source: Ambulatory Visit | Attending: Advanced Practice Midwife | Admitting: Advanced Practice Midwife

## 2021-07-22 ENCOUNTER — Other Ambulatory Visit: Payer: Self-pay

## 2021-07-22 ENCOUNTER — Ambulatory Visit (INDEPENDENT_AMBULATORY_CARE_PROVIDER_SITE_OTHER): Payer: BC Managed Care – PPO | Admitting: Advanced Practice Midwife

## 2021-07-22 VITALS — BP 118/68 | HR 78 | Ht 63.0 in | Wt 123.0 lb

## 2021-07-22 DIAGNOSIS — Z124 Encounter for screening for malignant neoplasm of cervix: Secondary | ICD-10-CM | POA: Diagnosis not present

## 2021-07-22 DIAGNOSIS — Z01419 Encounter for gynecological examination (general) (routine) without abnormal findings: Secondary | ICD-10-CM | POA: Diagnosis not present

## 2021-07-22 NOTE — Progress Notes (Signed)
Gynecology Annual Exam  PCP: Juanell Fairly, MD (Inactive)  Chief Complaint:  Chief Complaint  Patient presents with   Annual Exam    History of Present Illness:Patient is a 66 y.o. G2P2002 presents for annual exam. The patient has no complaints today.   LMP: No LMP recorded (lmp unknown). Patient is postmenopausal.   The patient is not sexually active. She denies dyspareunia.  The patient does perform self breast exams.  There is no notable family history of breast or ovarian cancer in her family.  The patient wears seatbelts: yes.   The patient has regular exercise:  she has a well rounded exercise routine- cardio, strength, flexibility/balance, she admits healthy lifestyle; diet, hydration, sleep and non-smoker .    The patient denies current symptoms of depression.     Review of Systems: Review of Systems  Constitutional:  Negative for chills and fever.  HENT:  Negative for congestion, ear discharge, ear pain, hearing loss, sinus pain and sore throat.   Eyes:  Negative for blurred vision and double vision.  Respiratory:  Negative for cough, shortness of breath and wheezing.   Cardiovascular:  Negative for chest pain, palpitations and leg swelling.  Gastrointestinal:  Negative for abdominal pain, blood in stool, constipation, diarrhea, heartburn, melena, nausea and vomiting.  Genitourinary:  Negative for dysuria, flank pain, frequency, hematuria and urgency.  Musculoskeletal:  Negative for back pain, joint pain and myalgias.  Skin:  Negative for itching and rash.  Neurological:  Negative for dizziness, tingling, tremors, sensory change, speech change, focal weakness, seizures, loss of consciousness, weakness and headaches.  Endo/Heme/Allergies:  Negative for environmental allergies. Does not bruise/bleed easily.  Psychiatric/Behavioral:  Negative for depression, hallucinations, memory loss, substance abuse and suicidal ideas. The patient is not nervous/anxious and does not  have insomnia.    Past Medical History:  Patient Active Problem List   Diagnosis Date Noted   Closed Colles' fracture 04/05/2018   Acquired hypothyroidism 03/20/2017   Exercise-induced asthma 09/24/2014    Past Surgical History:  Past Surgical History:  Procedure Laterality Date   CARPAL TUNNEL RELEASE     CESAREAN SECTION     KNEE SURGERY     REPAIR ANKLE LIGAMENT     SQUAMOUS CELL CARCINOMA EXCISION      Gynecologic History:  No LMP recorded (lmp unknown). Patient is postmenopausal. Last Pap: 5 years ago Results were:  no abnormalities  Last mammogram: 03/21/21 Results were: Gillian Shields I  Obstetric [id number]  Family History:  Family History  Problem Relation Age of Onset   Breast cancer Mother 90   Coronary artery disease Mother    Hypertension Mother    Breast cancer Cousin        mat cousin   Hypertension Father    Stroke Father    Thyroid cancer Father    Hypertension Maternal Grandmother    Hypertension Maternal Grandfather    Hypertension Paternal Grandmother    Hypertension Paternal Grandfather     Social History:  Social History   Socioeconomic History   Marital status: Married    Spouse name: Not on file   Number of children: Not on file   Years of education: Not on file   Highest education level: Not on file  Occupational History   Not on file  Tobacco Use   Smoking status: Never   Smokeless tobacco: Never  Vaping Use   Vaping Use: Never used  Substance and Sexual Activity   Alcohol use: Not  Currently   Drug use: No   Sexual activity: Not Currently    Birth control/protection: Post-menopausal  Other Topics Concern   Not on file  Social History Narrative   Not on file   Social Determinants of Health   Financial Resource Strain: Not on file  Food Insecurity: Not on file  Transportation Needs: Not on file  Physical Activity: Not on file  Stress: Not on file  Social Connections: Not on file  Intimate Partner Violence: Not on file     Allergies:  Allergies  Allergen Reactions   Celecoxib     Medications: Prior to Admission medications   Medication Sig Start Date End Date Taking? Authorizing Provider  albuterol (VENTOLIN HFA) 108 (90 Base) MCG/ACT inhaler USE 2 PUFFS BY 15 MINUTES PRIOR TO EXERCISE. 06/01/20  Yes Will Bonnet, MD  ARMOUR THYROID 90 MG tablet TAKE (1) TABLET BY MOUTH EVERY DAY 07/06/20  Yes Will Bonnet, MD  Cholecalciferol 50 MCG (2000 UT) CAPS Vitamin D3 50 mcg (2,000 unit) capsule   2 capsules every day by oral route. 08/27/13  Yes [provider]    Physical Exam Vitals: Blood pressure 118/68, pulse 78, height 5\' 3"  (1.6 m), weight 123 lb (55.8 kg).  General: NAD HEENT: normocephalic, anicteric Thyroid: no enlargement, no palpable nodules Pulmonary: No increased work of breathing, CTAB Cardiovascular: RRR, distal pulses 2+ Breast: Breast symmetrical, no tenderness, no palpable nodules or masses, no skin or nipple retraction present, no nipple discharge.  No axillary or supraclavicular lymphadenopathy. Abdomen: NABS, soft, non-tender, non-distended.  Umbilicus without lesions.  No hepatomegaly, splenomegaly or masses palpable. No evidence of hernia  Genitourinary:  External: Normal external female genitalia.  Normal urethral meatus, normal Bartholin's and Skene's glands.    Vagina: Normal vaginal mucosa, no evidence of prolapse.    Cervix: Grossly normal in appearance, no bleeding Extremities: no edema, erythema, or tenderness Neurologic: Grossly intact Psychiatric: mood appropriate, affect full    Assessment: 66 y.o. G2P2002 routine annual exam  Plan: Problem List Items Addressed This Visit   None Visit Diagnoses     Well woman exam with routine gynecological exam    -  Primary   Relevant Orders   Cytology - PAP   Screening for cervical cancer       Relevant Orders   Cytology - PAP       1) Mammogram - recommend yearly screening mammogram.  Mammogram Is up  to date. Patient self schedules every 2 years.  2) STI screening  was not offered and therefore not obtained  3) ASCCP guidelines and rationale discussed.  Patient opts for every 5 years screening interval with possible discontinuation after this year's PAP  4) Osteoporosis  - per USPTF routine screening DEXA at age 2  Consider FDA-approved medical therapies in postmenopausal women and men aged 23 years and older, based on the following: a) A hip or vertebral (clinical or morphometric) fracture b) T-score ? -2.5 at the femoral neck or spine after appropriate evaluation to exclude secondary causes C) Low bone mass (T-score between -1.0 and -2.5 at the femoral neck or spine) and a 10-year probability of a hip fracture ? 3% or a 10-year probability of a major osteoporosis-related fracture ? 20% based on the [place]-adapted WHO algorithm   5) Routine healthcare maintenance including cholesterol, diabetes screening discussed managed by PCP  6) Colonoscopy is up to date (2019).  Screening recommended starting at age 65 for average risk individuals, age 58 for  individuals deemed at increased risk (including African Americans) and recommended to continue until age 35.  For patient age 62-85 individualized approach is recommended.  Gold standard screening is via colonoscopy, Cologuard screening is an acceptable alternative for patient unwilling or unable to undergo colonoscopy.  "Colorectal cancer screening for average?risk adults: 2018 guideline update from the [employer]: A Cancer Journal for Clinicians: Oct 25, 2016   7) Return for as needed and for annual.    Christean Leaf, [place] [facility] 07/22/21, 2:00 PM

## 2021-07-27 LAB — CYTOLOGY - PAP
Comment: NEGATIVE
Diagnosis: NEGATIVE
High risk HPV: NEGATIVE

## 2021-10-14 ENCOUNTER — Other Ambulatory Visit: Payer: Self-pay | Admitting: Advanced Practice Midwife

## 2021-10-14 DIAGNOSIS — E039 Hypothyroidism, unspecified: Secondary | ICD-10-CM

## 2021-10-17 ENCOUNTER — Other Ambulatory Visit: Payer: Self-pay | Admitting: Advanced Practice Midwife

## 2021-10-17 DIAGNOSIS — E039 Hypothyroidism, unspecified: Secondary | ICD-10-CM

## 2021-10-17 MED ORDER — ARMOUR THYROID 90 MG PO TABS: ORAL_TABLET | ORAL | 4 refills | Status: DC

## 2021-10-17 NOTE — Progress Notes (Signed)
[facility] thyroid refill per patient request. Recommended she have TSH level checked.

## 2022-06-26 ENCOUNTER — Other Ambulatory Visit: Payer: Self-pay | Admitting: Advanced Practice Midwife

## 2022-06-26 DIAGNOSIS — E039 Hypothyroidism, unspecified: Secondary | ICD-10-CM

## 2022-07-18 IMAGING — MG MM DIGITAL SCREENING BILAT W/ TOMO AND CAD
8 of 14 series · 9 of 40 positions shown · non-contrast
Comparison: Previous exam(s).

CLINICAL DATA: Screening.

EXAM:
DIGITAL SCREENING BILATERAL MAMMOGRAM WITH TOMOSYNTHESIS AND CAD
TECHNIQUE: Bilateral screening digital craniocaudal and mediolateral oblique
mammograms were obtained. Bilateral screening digital breast
tomosynthesis was performed. The images were evaluated with
computer-aided detection.

[R MLO synth-2D (1 of 2)]
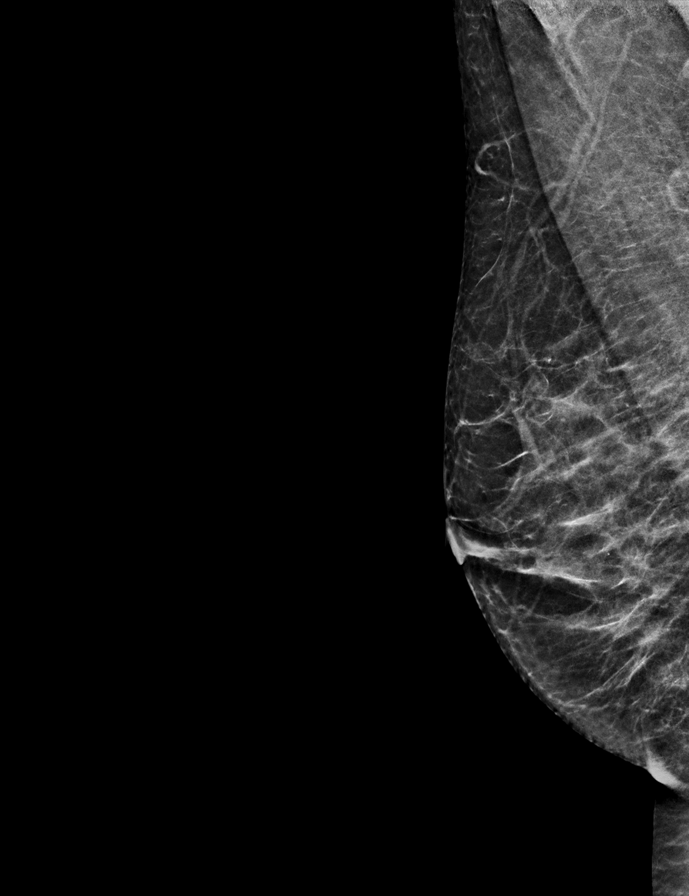

[R MLO synth-2D (2 of 2)]
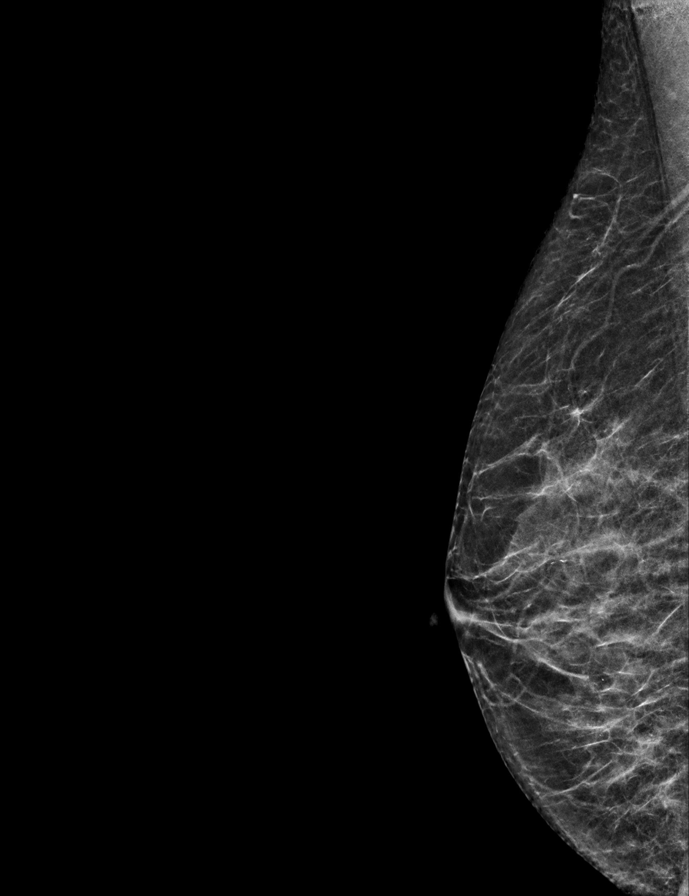

[L MLO synth-2D (1 of 2)]
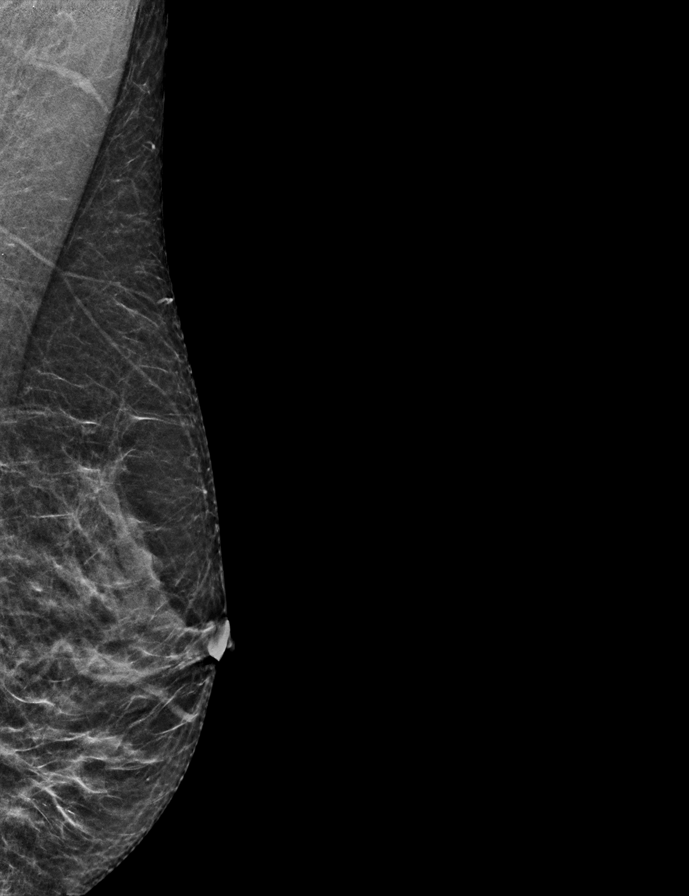

[R CC synth-2D]
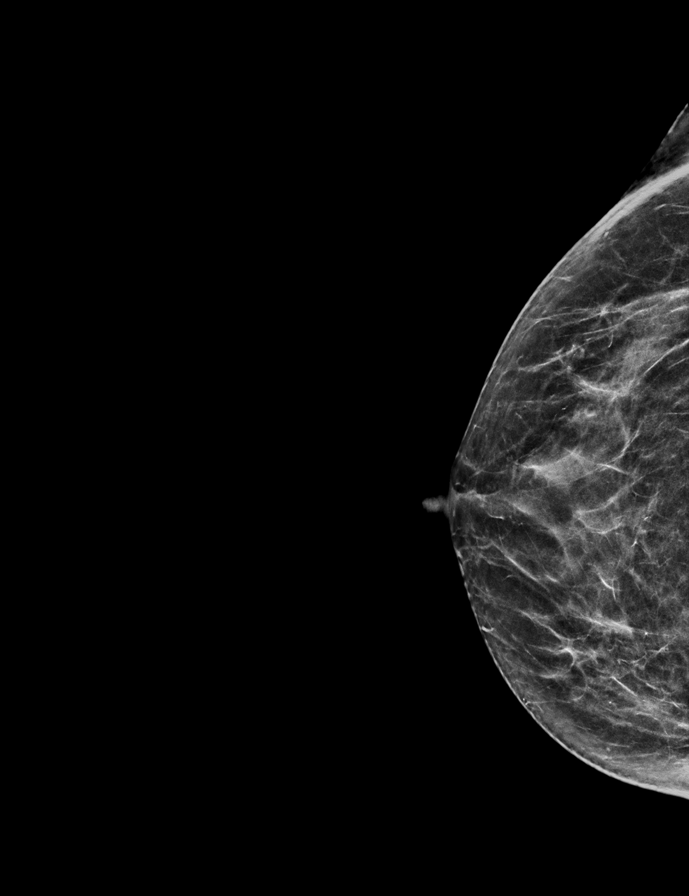

[L MLO synth-2D (2 of 2)]
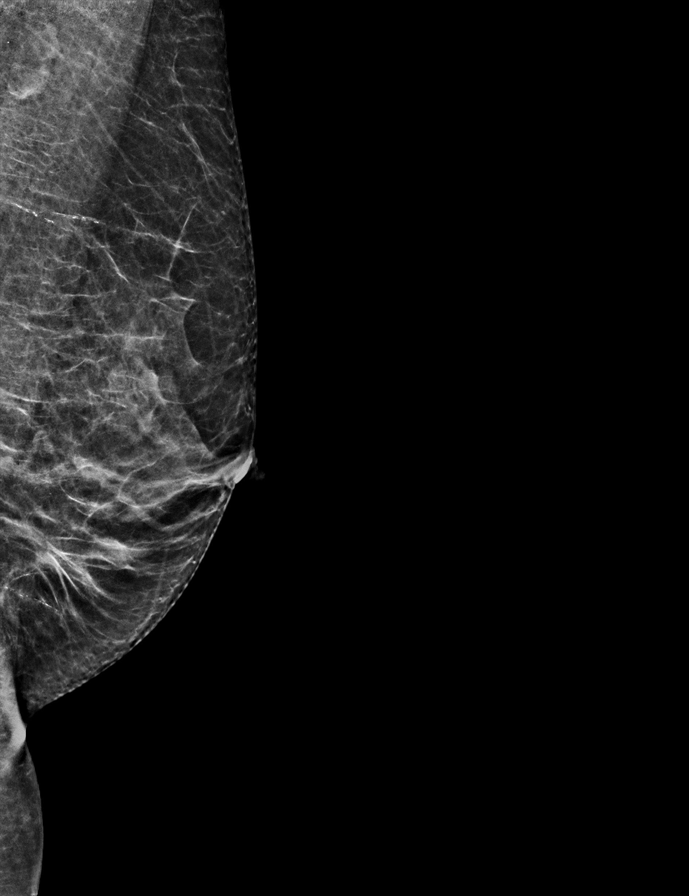

[L CC synth-2D (1 of 2)]
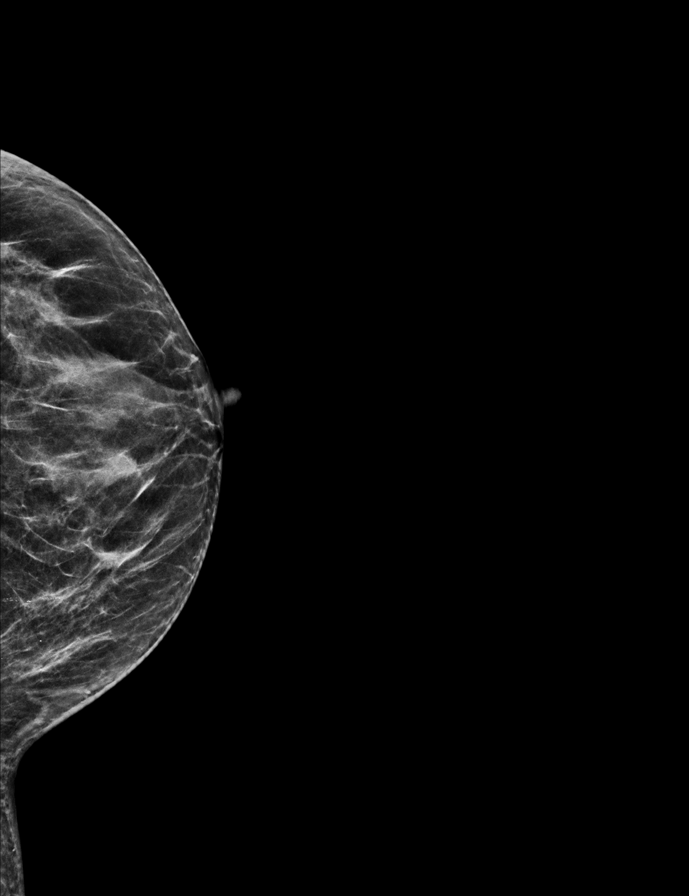

[L CC synth-2D (2 of 2)]
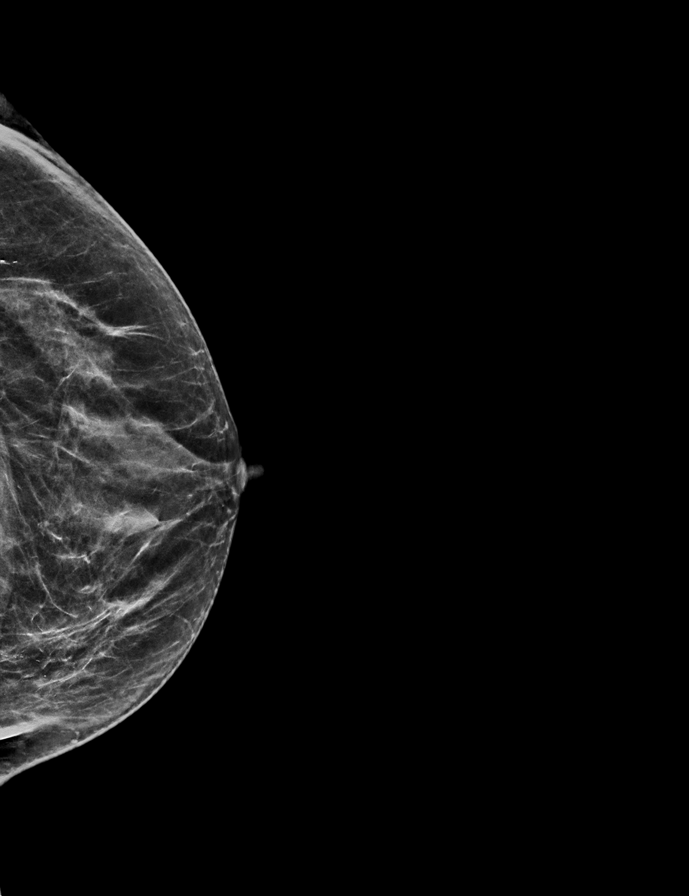

[R CC tomo · 2 of 51 frames shown]
[frame 17/51]
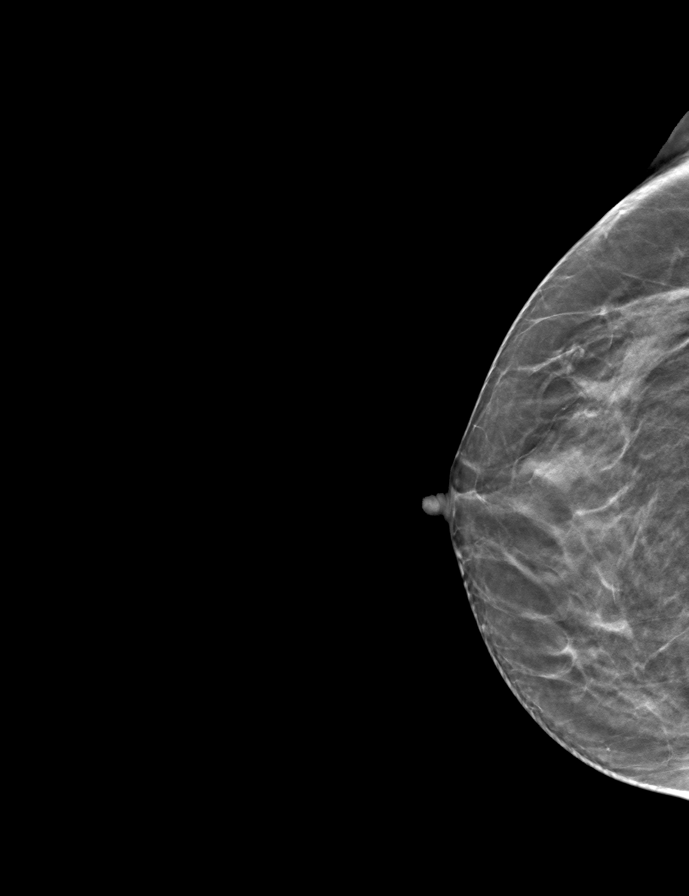
[frame 26/51]
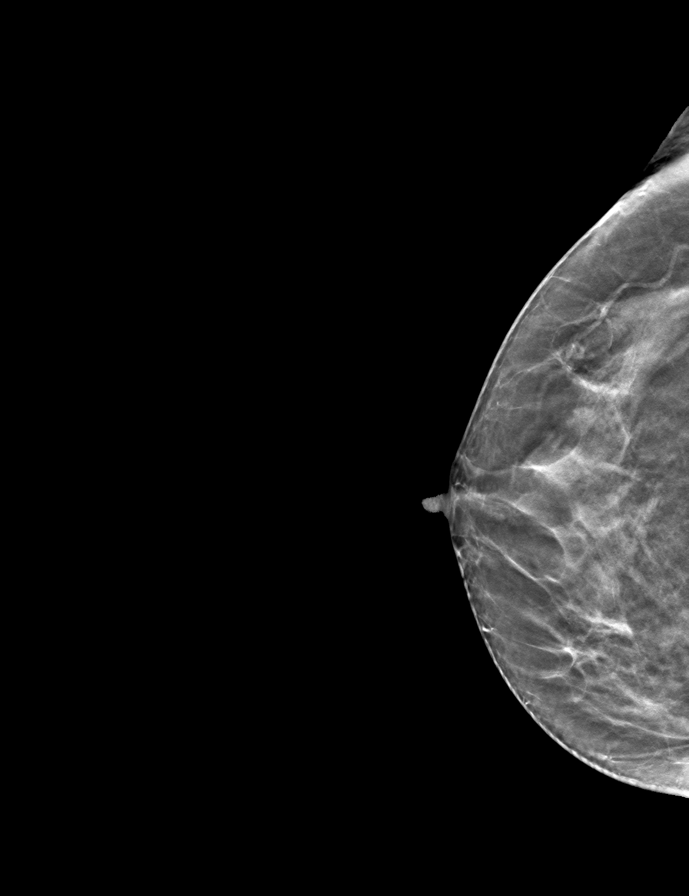

[9 of 40 positions shown; findings below may reference images not displayed]

ACR Breast Density Category c: The breast tissue is heterogeneously
dense, which may obscure small masses.
FINDINGS: There are no findings suspicious for malignancy.
IMPRESSION: No mammographic evidence of malignancy. A result letter of this
screening mammogram will be mailed directly to the patient.

RECOMMENDATION:
Screening mammogram in one year. (Code:Q3-W-BC3)

BI-RADS CATEGORY  1: Negative.

## 2022-07-20 ENCOUNTER — Other Ambulatory Visit: Payer: Self-pay | Admitting: Advanced Practice Midwife

## 2022-07-20 DIAGNOSIS — E039 Hypothyroidism, unspecified: Secondary | ICD-10-CM

## 2022-07-20 MED ORDER — THYROID 180 MG PO TABS: ORAL_TABLET | ORAL | 2 refills | Status: DC

## 2022-07-20 NOTE — Progress Notes (Signed)
Change thyroid Rx to 180 mg tabs (to cut in half daily) per patient preference.

## 2022-07-20 NOTE — Telephone Encounter (Signed)
Rx approved and new order already placed

## 2023-03-23 ENCOUNTER — Other Ambulatory Visit: Payer: Self-pay | Admitting: Certified Nurse Midwife

## 2023-03-23 ENCOUNTER — Ambulatory Visit (INDEPENDENT_AMBULATORY_CARE_PROVIDER_SITE_OTHER): Payer: BC Managed Care – PPO | Admitting: Certified Nurse Midwife

## 2023-03-23 ENCOUNTER — Encounter: Payer: Self-pay | Admitting: Certified Nurse Midwife

## 2023-03-23 VITALS — BP 112/75 | HR 78 | Ht 63.0 in | Wt 114.0 lb

## 2023-03-23 DIAGNOSIS — Z1231 Encounter for screening mammogram for malignant neoplasm of breast: Secondary | ICD-10-CM

## 2023-03-23 DIAGNOSIS — Z0000 Encounter for general adult medical examination without abnormal findings: Secondary | ICD-10-CM | POA: Diagnosis not present

## 2023-03-23 DIAGNOSIS — E039 Hypothyroidism, unspecified: Secondary | ICD-10-CM

## 2023-03-23 DIAGNOSIS — Z136 Encounter for screening for cardiovascular disorders: Secondary | ICD-10-CM

## 2023-03-23 DIAGNOSIS — Z8249 Family history of ischemic heart disease and other diseases of the circulatory system: Secondary | ICD-10-CM

## 2023-03-23 DIAGNOSIS — Z13820 Encounter for screening for osteoporosis: Secondary | ICD-10-CM

## 2023-03-23 NOTE — Patient Instructions (Signed)
Health Maintenance, Female Adopting a healthy lifestyle and getting preventive care are important in promoting health and wellness. Ask your health care provider about: The right schedule for you to have regular tests and exams. Things you can do on your own to prevent diseases and keep yourself healthy. What should I know about diet, weight, and exercise? Eat a healthy diet  Eat a diet that includes plenty of vegetables, fruits, low-fat dairy products, and lean protein. Do not eat a lot of foods that are high in solid fats, added sugars, or sodium. Maintain a healthy weight Body mass index (BMI) is used to identify weight problems. It estimates body fat based on height and weight. Your health care provider can help determine your BMI and help you achieve or maintain a healthy weight. Get regular exercise Get regular exercise. This is one of the most important things you can do for your health. Most adults should: Exercise for at least 150 minutes each week. The exercise should increase your heart rate and make you sweat (moderate-intensity exercise). Do strengthening exercises at least twice a week. This is in addition to the moderate-intensity exercise. Spend less time sitting. Even light physical activity can be beneficial. Watch cholesterol and blood lipids Have your blood tested for lipids and cholesterol at 66 years of age, then have this test every 5 years. Have your cholesterol levels checked more often if: Your lipid or cholesterol levels are high. You are older than 66 years of age. You are at high risk for heart disease. What should I know about cancer screening? Depending on your health history and family history, you may need to have cancer screening at various ages. This may include screening for: Breast cancer. Cervical cancer. Colorectal cancer. Skin cancer. Lung cancer. What should I know about heart disease, diabetes, and high blood pressure? Blood pressure and heart  disease High blood pressure causes heart disease and increases the risk of stroke. This is more likely to develop in people who have high blood pressure readings or are overweight. Have your blood pressure checked: Every 3-5 years if you are 61-69 years of age. Every year if you are 48 years old or older. Diabetes Have regular diabetes screenings. This checks your fasting blood sugar level. Have the screening done: Once every three years after age 30 if you are at a normal weight and have a low risk for diabetes. More often and at a younger age if you are overweight or have a high risk for diabetes. What should I know about preventing infection? Hepatitis B If you have a higher risk for hepatitis B, you should be screened for this virus. Talk with your health care provider to find out if you are at risk for hepatitis B infection. Hepatitis C Testing is recommended for: Everyone born from [birth year] through 1964. Anyone with known risk factors for hepatitis C. Sexually transmitted infections (STIs) Get screened for STIs, including gonorrhea and chlamydia, if: You are sexually active and are younger than 66 years of age. You are older than 66 years of age and your health care provider tells you that you are at risk for this type of infection. Your sexual activity has changed since you were last screened, and you are at increased risk for chlamydia or gonorrhea. Ask your health care provider if you are at risk. Ask your health care provider about whether you are at high risk for HIV. Your health care provider may recommend a prescription medicine to help prevent HIV  infection. If you choose to take medicine to prevent HIV, you should first get tested for HIV. You should then be tested every 3 months for as long as you are taking the medicine. Pregnancy If you are about to stop having your period (premenopausal) and you may become pregnant, seek counseling before you get pregnant. Take 400 to 800  micrograms (mcg) of folic acid every day if you become pregnant. Ask for birth control (contraception) if you want to prevent pregnancy. Osteoporosis and menopause Osteoporosis is a disease in which the bones lose minerals and strength with aging. This can result in bone fractures. If you are 53 years old or older, or if you are at risk for osteoporosis and fractures, ask your health care provider if you should: Be screened for bone loss. Take a calcium or vitamin D supplement to lower your risk of fractures. Be given hormone replacement therapy (HRT) to treat symptoms of menopause. Follow these instructions at home: Alcohol use Do not drink alcohol if: Your health care provider tells you not to drink. You are pregnant, may be pregnant, or are planning to become pregnant. If you drink alcohol: Limit how much you have to: 0-1 drink a day. Know how much alcohol is in your drink. In the [place] one drink equals one 12 oz bottle of beer (355 mL), one 5 oz glass of wine (148 mL), or one 1 oz glass of hard liquor (44 mL). Lifestyle Do not use any products that contain nicotine or tobacco. These products include cigarettes, chewing tobacco, and vaping devices, such as e-cigarettes. If you need help quitting, ask your health care provider. Do not use street drugs. Do not share needles. Ask your health care provider for help if you need support or information about quitting drugs. General instructions Schedule regular health, dental, and eye exams. Stay current with your vaccines. Tell your health care provider if: You often feel depressed. You have ever been abused or do not feel safe at home. Summary Adopting a healthy lifestyle and getting preventive care are important in promoting health and wellness. Follow your health care provider's instructions about healthy diet, exercising, and getting tested or screened for diseases. Follow your health care provider's instructions on monitoring your  cholesterol and blood pressure. This information is not intended to replace advice given to you by your health care provider. Make sure you discuss any questions you have with your health care provider. Document Revised: 10/04/2020 Document Reviewed: 10/04/2020 Elsevier Patient Education  2024 [employer]  Colonoscopy, Adult A colonoscopy is a procedure to look at the entire large intestine. This procedure is done using a long, thin, flexible tube that has a camera on the end. You may have a colonoscopy: As a part of normal colorectal screening. If you have certain symptoms, such as: A low number of red blood cells in your blood (anemia). Diarrhea that does not go away. Pain in your abdomen. Blood in your stool. A colonoscopy can help screen for and diagnose medical problems, including: An abnormal growth of cells or tissue (tumor). Abnormal growths within the lining of your intestine (polyps). Inflammation. Areas of bleeding. Tell your health care provider about: Any allergies you have. All medicines you are taking, including vitamins, herbs, eye drops, creams, and over-the-counter medicines. Any problems you or family members have had with anesthetic medicines. Any bleeding problems you have. Any surgeries you have had. Any medical conditions you have. Any problems you have had with having bowel movements. Whether you  are pregnant or may be pregnant. What are the risks? Generally, this is a safe procedure. However, problems may occur, including: Bleeding. Damage to your intestine. Allergic reactions to medicines given during the procedure. Infection. This is rare. What happens before the procedure? Eating and drinking restrictions Follow instructions from your health care provider about eating or drinking restrictions, which may include: A few days before the procedure: Follow a low-fiber diet. Avoid nuts, seeds, dried fruit, raw fruits, and vegetables. 1-3 days before  the procedure: Eat only gelatin dessert or ice pops. Drink only clear liquids, such as water, clear juice, clear broth or bouillon, black coffee or tea, or clear soft drinks or sports drinks. Avoid liquids that contain red or purple dye. The day of the procedure: Do not eat solid foods. You may continue to drink clear liquids until up to 2 hours before the procedure. Do not eat or drink anything starting 2 hours before the procedure, or within the time period that your health care provider recommends. Bowel prep If you were prescribed a bowel prep to take by mouth (orally) to clean out your colon: Take it as told by your health care provider. Starting the day before your procedure, you will need to drink a large amount of liquid medicine. The liquid will cause you to have many bowel movements of loose stool until your stool becomes almost clear or light green. If your skin or the opening between the buttocks (anus) gets irritated from diarrhea, you may relieve the irritation using: Wipes with medicine in them, such as adult wet wipes with aloe and vitamin E. A product to soothe skin, such as petroleum jelly. If you vomit while drinking the bowel prep: Take a break for up to 60 minutes. Begin the bowel prep again. Call your health care provider if you keep vomiting or you cannot take the bowel prep without vomiting. To clean out your colon, you may also be given: Laxative medicines. These help you have a bowel movement. Instructions for enema use. An enema is liquid medicine injected into your rectum. Medicines Ask your health care provider about: Changing or stopping your regular medicines or supplements. This is especially important if you are taking iron supplements, diabetes medicines, or blood thinners. Taking medicines such as aspirin and ibuprofen. These medicines can thin your blood. Do not take these medicines unless your health care provider tells you to take them. Taking  over-the-counter medicines, vitamins, herbs, and supplements. General instructions Ask your health care provider what steps will be taken to help prevent infection. These may include washing skin with a germ-killing soap. If you will be going home right after the procedure, plan to have a responsible adult: Take you home from the hospital or clinic. You will not be allowed to drive. Care for you for the time you are told. What happens during the procedure?  An IV will be inserted into one of your veins. You will be given a medicine to make you fall asleep (general anesthetic). You will lie on your side with your knees bent. A lubricant will be put on the tube. Then the tube will be: Inserted into your anus. Gently eased through all parts of your large intestine. Air will be sent into your colon to keep it open. This may cause some pressure or cramping. Images will be taken with the camera and will appear on a screen. A small tissue sample may be removed to be looked at under a microscope (biopsy). The tissue  may be sent to a lab for testing if any signs of problems are found. If small polyps are found, they may be removed and checked for cancer cells. When the procedure is finished, the tube will be removed. The procedure may vary among health care providers and hospitals. What happens after the procedure? Your blood pressure, heart rate, breathing rate, and blood oxygen level will be monitored until you leave the hospital or clinic. You may have a small amount of blood in your stool. You may pass gas and have mild cramping or bloating in your abdomen. This is caused by the air that was used to open your colon during the exam. If you were given a sedative during the procedure, it can affect you for several hours. Do not drive or operate machinery until your health care provider says that it is safe. It is up to you to get the results of your procedure. Ask your health care provider, or the  department that is doing the procedure, when your results will be ready. Summary A colonoscopy is a procedure to look at the entire large intestine. Follow instructions from your health care provider about eating and drinking before the procedure. If you were prescribed an oral bowel prep to clean out your colon, take it as told by your health care provider. During the colonoscopy, a flexible tube with a camera on its end is inserted into the anus and then passed into all parts of the large intestine. This information is not intended to replace advice given to you by your health care provider. Make sure you discuss any questions you have with your health care provider. Document Revised: 06/27/2022 Document Reviewed: 01/05/2021 Elsevier Patient Education  2024 [employer].

## 2023-03-23 NOTE — Progress Notes (Unsigned)
   ANNUAL EXAM Patient name: Brittany Rose MRN [id number]  Date of birth: Apr 07, 1956 Chief Complaint:   Annual Exam  History of Present Illness:   Brittany Rose is a 67 y.o. [phone] {race:25618} female being seen today for a routine annual exam.  Current complaints: ***  No LMP recorded (lmp unknown). Patient is postmenopausal.   The pregnancy intention screening data noted above was reviewed. Potential methods of contraception were discussed. The patient elected to proceed with No data recorded.      Component Value Date/Time   DIAGPAP  07/22/2021 1354    - Negative for intraepithelial lesion or malignancy (NILM)   HPVHIGH Negative 07/22/2021 1354   ADEQPAP  07/22/2021 1354    Satisfactory for evaluation. The presence or absence of an   ADEQPAP  07/22/2021 1354    endocervical/transformation zone component cannot be determined because   ADEQPAP of atrophy. 07/22/2021 1354       Last pap ***. Results were: {Pap findings:25134}. H/O abnormal pap: {yes/yes***/no:23866} Last mammogram: ***. Results were: {normal, abnormal, n/a:23837}. Family h/o breast cancer: {yes***/no:23838} Last colonoscopy: ***. Results were: {normal, abnormal, n/a:23837}. Family h/o colorectal cancer: {yes***/no:23838}     03/23/2017   11:02 AM  Depression screen PHQ 2/9  Decreased Interest 0  Down, Depressed, Hopeless 0  PHQ - 2 Score 0  Altered sleeping 0  Tired, decreased energy 0  Change in appetite 0  Feeling bad or failure about yourself  0  Trouble concentrating 0  Moving slowly or fidgety/restless 0  Suicidal thoughts 0  PHQ-9 Score 0  Difficult doing work/chores Not difficult at all         No data to display            Review of Systems:   Pertinent items are noted in HPI Denies any headaches, blurred vision, fatigue, shortness of breath, chest pain, abdominal pain, abnormal vaginal discharge/itching/odor/irritation, problems with periods, bowel movements, urination, or  intercourse unless otherwise stated above. Pertinent History Reviewed:  Reviewed past medical,surgical, social and family history.  Reviewed problem list, medications and allergies. Physical Assessment:   Vitals:   03/23/23 1332  BP: 112/75  Pulse: 78  Weight: 114 lb (51.7 kg)  Height: 5\' 3"  (1.6 m)  Body mass index is 20.19 kg/m.       Physical Exam   No results found for this or any previous visit (from the past 24 hour(s)).  Assessment & Plan:  1. Acquired hypothyroidism  2. Routine general medical examination at a health care facility  3. Visit for screening mammogram - Mammogram Digital Screening; Future   Mammogram: {Mammo f/u:25212}, or sooner if problems Colonoscopy: {TCS f/u:25213}, or sooner if problems  Orders Placed This Encounter  Procedures   Mammogram Digital Screening     Meds: No orders of the defined types were placed in this encounter.   Follow-up: Return in 1 year (on 03/22/2024) for Annual exam.  Dominica Severin, CNM 03/23/2023 1:42 PM

## 2023-08-02 ENCOUNTER — Other Ambulatory Visit: Payer: Self-pay | Admitting: Advanced Practice Midwife

## 2023-08-02 DIAGNOSIS — E039 Hypothyroidism, unspecified: Secondary | ICD-10-CM

## 2023-08-08 ENCOUNTER — Other Ambulatory Visit: Payer: Self-pay | Admitting: Advanced Practice Midwife

## 2023-08-08 DIAGNOSIS — E039 Hypothyroidism, unspecified: Secondary | ICD-10-CM

## 2023-08-09 NOTE — Telephone Encounter (Signed)
 Would you refill this? You did her annual in October. Erskine Squibb refilled last. Or does this need to be PCP?

## 2023-09-04 ENCOUNTER — Ambulatory Visit
Admission: RE | Admit: 2023-09-04 | Discharge: 2023-09-04 | Disposition: A | Discharge status: Home / Self Care | Source: Ambulatory Visit | Attending: Certified Nurse Midwife | Admitting: Certified Nurse Midwife

## 2023-09-04 DIAGNOSIS — Z1231 Encounter for screening mammogram for malignant neoplasm of breast: Secondary | ICD-10-CM | POA: Diagnosis present

## 2023-09-04 DIAGNOSIS — Z13820 Encounter for screening for osteoporosis: Secondary | ICD-10-CM | POA: Insufficient documentation
